# Patient Record
Sex: Male | Born: 1990 | Race: White | Hispanic: No | Marital: Single | Smoking: Never smoker
Health system: Southern US, Community
[De-identification: ages and names within clinical notes are randomized; demographics above are authoritative.]

## PROBLEM LIST (undated history)

## (undated) DIAGNOSIS — J45909 Unspecified asthma, uncomplicated: Secondary | ICD-10-CM

---
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
## 2012-05-05 ENCOUNTER — Emergency Department: Payer: Self-pay | Admitting: Emergency Medicine

## 2015-04-27 ENCOUNTER — Emergency Department: Payer: Self-pay

## 2015-04-27 ENCOUNTER — Emergency Department
Admission: EM | Admit: 2015-04-27 | Discharge: 2015-04-27 | Disposition: A | Discharge status: Home / Self Care | Payer: Self-pay | Attending: Emergency Medicine | Admitting: Emergency Medicine

## 2015-04-27 DIAGNOSIS — T148 Other injury of unspecified body region: Secondary | ICD-10-CM | POA: Insufficient documentation

## 2015-04-27 DIAGNOSIS — Y998 Other external cause status: Secondary | ICD-10-CM | POA: Insufficient documentation

## 2015-04-27 DIAGNOSIS — T07XXXA Unspecified multiple injuries, initial encounter: Secondary | ICD-10-CM

## 2015-04-27 DIAGNOSIS — R52 Pain, unspecified: Secondary | ICD-10-CM

## 2015-04-27 DIAGNOSIS — S299XXA Unspecified injury of thorax, initial encounter: Secondary | ICD-10-CM | POA: Insufficient documentation

## 2015-04-27 DIAGNOSIS — S199XXA Unspecified injury of neck, initial encounter: Secondary | ICD-10-CM | POA: Insufficient documentation

## 2015-04-27 DIAGNOSIS — Y9289 Other specified places as the place of occurrence of the external cause: Secondary | ICD-10-CM | POA: Insufficient documentation

## 2015-04-27 DIAGNOSIS — Y9389 Activity, other specified: Secondary | ICD-10-CM | POA: Insufficient documentation

## 2015-04-27 DIAGNOSIS — S79912A Unspecified injury of left hip, initial encounter: Secondary | ICD-10-CM | POA: Insufficient documentation

## 2015-04-27 DIAGNOSIS — S3991XA Unspecified injury of abdomen, initial encounter: Secondary | ICD-10-CM | POA: Insufficient documentation

## 2015-04-27 LAB — CBC
HCT: 40.5 % (ref 40.0–52.0)
Hemoglobin: 14 g/dL (ref 13.0–18.0)
MCH: 30.5 pg (ref 26.0–34.0)
MCHC: 34.4 g/dL (ref 32.0–36.0)
MCV: 88.5 fL (ref 80.0–100.0)
PLATELETS: 244 10*3/uL (ref 150–440)
RBC: 4.58 MIL/uL (ref 4.40–5.90)
RDW: 13.4 % (ref 11.5–14.5)
WBC: 9.8 10*3/uL (ref 3.8–10.6)

## 2015-04-27 LAB — BASIC METABOLIC PANEL
Anion gap: 15 (ref 5–15)
BUN: 9 mg/dL (ref 6–20)
CHLORIDE: 109 mmol/L (ref 101–111)
CO2: 17 mmol/L — ABNORMAL LOW (ref 22–32)
CREATININE: 0.57 mg/dL — AB (ref 0.61–1.24)
Calcium: 8.8 mg/dL — ABNORMAL LOW (ref 8.9–10.3)
GFR calc Af Amer: >60 mL/min (ref >60)
GLUCOSE: 100 mg/dL — AB (ref 65–99)
POTASSIUM: 3.9 mmol/L (ref 3.5–5.1)
Sodium: 141 mmol/L (ref 135–145)

## 2015-04-27 MED ORDER — IBUPROFEN 800 MG PO TABS
ORAL_TABLET | ORAL | Status: AC
  Administered 2015-04-27: 800 mg via ORAL
  Filled 2015-04-27: qty 1

## 2015-04-27 MED ORDER — OXYCODONE-ACETAMINOPHEN 5-325 MG PO TABS
ORAL_TABLET | ORAL | Status: AC
  Administered 2015-04-27: 1 via ORAL
  Filled 2015-04-27: qty 1

## 2015-04-27 MED ORDER — IOHEXOL 350 MG/ML SOLN
125.0000 mL | Freq: Once | INTRAVENOUS | Status: AC | PRN
  Administered 2015-04-27: 125 mL via INTRAVENOUS

## 2015-04-27 MED ORDER — OXYCODONE-ACETAMINOPHEN 5-325 MG PO TABS
1.0000 | ORAL_TABLET | Freq: Once | ORAL | Status: AC
  Administered 2015-04-27: 1 via ORAL

## 2015-04-27 MED ORDER — IBUPROFEN 800 MG PO TABS
800.0000 mg | ORAL_TABLET | Freq: Once | ORAL | Status: AC
  Administered 2015-04-27: 800 mg via ORAL

## 2015-04-27 MED ORDER — ONDANSETRON HCL 4 MG/2ML IJ SOLN
4.0000 mg | Freq: Once | INTRAMUSCULAR | Status: AC
  Administered 2015-04-27: 4 mg via INTRAVENOUS
  Filled 2015-04-27: qty 2

## 2015-04-27 MED ORDER — MORPHINE SULFATE (PF) 4 MG/ML IV SOLN
4.0000 mg | Freq: Once | INTRAVENOUS | Status: AC
  Administered 2015-04-27: 4 mg via INTRAVENOUS
  Filled 2015-04-27: qty 1

## 2015-04-27 MED ORDER — SODIUM CHLORIDE 0.9 % IV BOLUS (SEPSIS)
1000.0000 mL | Freq: Once | INTRAVENOUS | Status: AC
  Administered 2015-04-27: 1000 mL via INTRAVENOUS

## 2015-04-27 MED ORDER — OXYCODONE-ACETAMINOPHEN 5-325 MG PO TABS: 1.0000 | ORAL_TABLET | Freq: Four times a day (QID) | ORAL | Status: AC | PRN
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
## 2015-04-27 NOTE — Discharge Instructions (Signed)
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
## 2015-10-30 ENCOUNTER — Emergency Department (HOSPITAL_COMMUNITY)
Admission: EM | Admit: 2015-10-30 | Discharge: 2015-10-30 | Disposition: A | Discharge status: Home / Self Care | Payer: No Typology Code available for payment source | Attending: Emergency Medicine | Admitting: Emergency Medicine

## 2015-10-30 ENCOUNTER — Encounter (HOSPITAL_COMMUNITY): Payer: Self-pay | Admitting: *Deleted

## 2015-10-30 DIAGNOSIS — Y939 Activity, unspecified: Secondary | ICD-10-CM | POA: Insufficient documentation

## 2015-10-30 DIAGNOSIS — Y92410 Unspecified street and highway as the place of occurrence of the external cause: Secondary | ICD-10-CM | POA: Diagnosis not present

## 2015-10-30 DIAGNOSIS — S161XXA Strain of muscle, fascia and tendon at neck level, initial encounter: Secondary | ICD-10-CM

## 2015-10-30 DIAGNOSIS — S060X0A Concussion without loss of consciousness, initial encounter: Secondary | ICD-10-CM | POA: Insufficient documentation

## 2015-10-30 DIAGNOSIS — Y999 Unspecified external cause status: Secondary | ICD-10-CM | POA: Insufficient documentation

## 2015-10-30 DIAGNOSIS — S0990XA Unspecified injury of head, initial encounter: Secondary | ICD-10-CM | POA: Diagnosis present

## 2015-10-30 MED ORDER — KETOROLAC TROMETHAMINE 30 MG/ML IJ SOLN
15.0000 mg | Freq: Once | INTRAMUSCULAR | Status: AC
  Administered 2015-10-30: 15 mg via INTRAMUSCULAR
  Filled 2015-10-30: qty 1
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
## 2015-10-30 NOTE — Discharge Instructions (Signed)
You have neck pain, possibly from a cervical strain and/or pinched nerve.  ° °SEEK IMMEDIATE MEDICAL ATTENTION IF: °You develop difficulties swallowing or breathing.  °You have new or worse numbness, weakness, tingling, or movement problems in your arms or legs.  °You develop increasing pain which is uncontrolled with medications.  °You have change in bowel or bladder function, or other concerns. ° ° ° °

## 2015-10-30 NOTE — ED Notes (Signed)
Delay explained to pt.  

## 2015-11-10 ENCOUNTER — Encounter (HOSPITAL_COMMUNITY): Payer: Self-pay | Admitting: *Deleted

## 2015-11-10 ENCOUNTER — Emergency Department (HOSPITAL_COMMUNITY): Payer: No Typology Code available for payment source

## 2015-11-10 ENCOUNTER — Emergency Department (HOSPITAL_COMMUNITY)
Admission: EM | Admit: 2015-11-10 | Discharge: 2015-11-10 | Disposition: A | Discharge status: Home / Self Care | Payer: No Typology Code available for payment source | Attending: Emergency Medicine | Admitting: Emergency Medicine

## 2015-11-10 DIAGNOSIS — Y92410 Unspecified street and highway as the place of occurrence of the external cause: Secondary | ICD-10-CM | POA: Diagnosis not present

## 2015-11-10 DIAGNOSIS — S39012A Strain of muscle, fascia and tendon of lower back, initial encounter: Secondary | ICD-10-CM | POA: Diagnosis not present

## 2015-11-10 DIAGNOSIS — M545 Low back pain, unspecified: Secondary | ICD-10-CM

## 2015-11-10 DIAGNOSIS — Y939 Activity, unspecified: Secondary | ICD-10-CM | POA: Diagnosis not present

## 2015-11-10 DIAGNOSIS — T148XXA Other injury of unspecified body region, initial encounter: Secondary | ICD-10-CM

## 2015-11-10 DIAGNOSIS — Y999 Unspecified external cause status: Secondary | ICD-10-CM | POA: Insufficient documentation

## 2015-11-10 DIAGNOSIS — S3992XA Unspecified injury of lower back, initial encounter: Secondary | ICD-10-CM | POA: Diagnosis present

## 2015-11-10 MED ORDER — IBUPROFEN 800 MG PO TABS: 800.0000 mg | ORAL_TABLET | Freq: Three times a day (TID) | ORAL | 0 refills | Status: AC

## 2015-11-10 MED ORDER — KETOROLAC TROMETHAMINE 60 MG/2ML IM SOLN
60.0000 mg | Freq: Once | INTRAMUSCULAR | Status: AC
  Administered 2015-11-10: 60 mg via INTRAMUSCULAR
  Filled 2015-11-10: qty 2

## 2015-11-10 MED ORDER — HYDROCODONE-ACETAMINOPHEN 5-325 MG PO TABS: 1.0000 | ORAL_TABLET | Freq: Four times a day (QID) | ORAL | 0 refills | Status: AC | PRN

## 2015-11-10 MED ORDER — METHOCARBAMOL 500 MG PO TABS: 500.0000 mg | ORAL_TABLET | Freq: Two times a day (BID) | ORAL | 0 refills | Status: AC

## 2015-11-10 NOTE — Discharge Instructions (Signed)
1. Medications: robaxin, naproxyn, vicodin, usual home medications 2. Treatment: rest, drink plenty of fluids, gentle stretching as discussed, alternate ice and heat 3. Follow Up: Please followup with your primary doctor in 3 days for discussion of your diagnoses and further evaluation after today's visit; if you do not have a primary care doctor use the resource guide provided to find one;  Return to the ER for worsening back pain, difficulty walking, loss of bowel or bladder control or other concerning symptoms    
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
## 2015-11-13 ENCOUNTER — Emergency Department (HOSPITAL_COMMUNITY): Payer: No Typology Code available for payment source

## 2015-11-13 ENCOUNTER — Encounter (HOSPITAL_COMMUNITY): Payer: Self-pay | Admitting: Emergency Medicine

## 2015-11-13 ENCOUNTER — Ambulatory Visit (HOSPITAL_COMMUNITY)
Admission: EM | Admit: 2015-11-13 | Discharge: 2015-11-13 | Disposition: A | Discharge status: Nursing Home (Includes ICF) | Payer: Self-pay | Attending: Emergency Medicine | Admitting: Emergency Medicine

## 2015-11-13 ENCOUNTER — Emergency Department (HOSPITAL_COMMUNITY)
Admission: EM | Admit: 2015-11-13 | Discharge: 2015-11-13 | Disposition: A | Discharge status: Home / Self Care | Payer: No Typology Code available for payment source | Attending: Physician Assistant | Admitting: Physician Assistant

## 2015-11-13 DIAGNOSIS — R110 Nausea: Secondary | ICD-10-CM

## 2015-11-13 DIAGNOSIS — R112 Nausea with vomiting, unspecified: Secondary | ICD-10-CM

## 2015-11-13 DIAGNOSIS — N059 Unspecified nephritic syndrome with unspecified morphologic changes: Secondary | ICD-10-CM

## 2015-11-13 DIAGNOSIS — R1011 Right upper quadrant pain: Secondary | ICD-10-CM

## 2015-11-13 DIAGNOSIS — R109 Unspecified abdominal pain: Secondary | ICD-10-CM | POA: Diagnosis present

## 2015-11-13 HISTORY — DX: Unspecified asthma, uncomplicated: J45.909

## 2015-11-13 LAB — CBC
HEMATOCRIT: 45.3 % (ref 39.0–52.0)
HEMOGLOBIN: 15.1 g/dL (ref 13.0–17.0)
MCH: 30.4 pg (ref 26.0–34.0)
MCHC: 33.3 g/dL (ref 30.0–36.0)
MCV: 91.1 fL (ref 78.0–100.0)
Platelets: 245 10*3/uL (ref 150–400)
RBC: 4.97 MIL/uL (ref 4.22–5.81)
RDW: 12.9 % (ref 11.5–15.5)
WBC: 6.7 10*3/uL (ref 4.0–10.5)

## 2015-11-13 LAB — URINALYSIS, ROUTINE W REFLEX MICROSCOPIC
Bilirubin Urine: NEGATIVE
GLUCOSE, UA: NEGATIVE mg/dL
KETONES UR: NEGATIVE mg/dL
LEUKOCYTES UA: NEGATIVE
NITRITE: NEGATIVE
PH: 5.5 (ref 5.0–8.0)
Protein, ur: 30 mg/dL — AB
SPECIFIC GRAVITY, URINE: 1.009 (ref 1.005–1.030)

## 2015-11-13 LAB — URINE MICROSCOPIC-ADD ON

## 2015-11-13 LAB — COMPREHENSIVE METABOLIC PANEL
ALT: 14 U/L — ABNORMAL LOW (ref 17–63)
ANION GAP: 10 (ref 5–15)
AST: 16 U/L (ref 15–41)
Albumin: 4.6 g/dL (ref 3.5–5.0)
Alkaline Phosphatase: 66 U/L (ref 38–126)
BILIRUBIN TOTAL: 1.3 mg/dL — AB (ref 0.3–1.2)
BUN: 12 mg/dL (ref 6–20)
CHLORIDE: 106 mmol/L (ref 101–111)
CO2: 23 mmol/L (ref 22–32)
Calcium: 9.3 mg/dL (ref 8.9–10.3)
Creatinine, Ser: 1.52 mg/dL — ABNORMAL HIGH (ref 0.61–1.24)
GFR calc Af Amer: >60 mL/min (ref >60)
Glucose, Bld: 107 mg/dL — ABNORMAL HIGH (ref 65–99)
POTASSIUM: 3.6 mmol/L (ref 3.5–5.1)
Sodium: 139 mmol/L (ref 135–145)
TOTAL PROTEIN: 7.2 g/dL (ref 6.5–8.1)

## 2015-11-13 LAB — LIPASE, BLOOD: LIPASE: 21 U/L (ref 11–51)

## 2015-11-13 MED ORDER — ONDANSETRON HCL 4 MG/2ML IJ SOLN
4.0000 mg | Freq: Once | INTRAMUSCULAR | Status: AC
  Administered 2015-11-13: 4 mg via INTRAVENOUS
  Filled 2015-11-13: qty 2

## 2015-11-13 MED ORDER — ONDANSETRON 4 MG PO TBDP
ORAL_TABLET | ORAL | Status: AC
  Filled 2015-11-13: qty 1

## 2015-11-13 MED ORDER — IOPAMIDOL (ISOVUE-300) INJECTION 61%
INTRAVENOUS | Status: AC
  Administered 2015-11-13: 100 mL
  Filled 2015-11-13: qty 100

## 2015-11-13 MED ORDER — ONDANSETRON 4 MG PO TBDP
4.0000 mg | ORAL_TABLET | Freq: Once | ORAL | Status: AC
  Administered 2015-11-13: 4 mg via ORAL

## 2015-11-13 NOTE — ED Notes (Signed)
Pt transported to CT ?

## 2015-11-13 NOTE — ED Notes (Signed)
Provided pt with graham crackers and sprite per MD request for PO/fluid challenge.
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
## 2015-11-13 NOTE — ED Notes (Signed)
Patient verbalized understanding of discharge instructions and denies any further needs or questions at this time. VS stable. Patient ambulatory with steady gait.  
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
## 2015-11-13 NOTE — Discharge Instructions (Signed)
We need you to follow up with her primary care physician this week. You will need to get your kidney function rechecked. Be sure to stay hydrated between now and then. If you have any concerns please return to the emergency department. You should also contact urology for follow-up.  Do not take any ibuprofen until you follow up.

## 2015-11-13 NOTE — ED Notes (Signed)
MD at bedside. 
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
## 2018-10-04 ENCOUNTER — Encounter (HOSPITAL_COMMUNITY): Payer: Self-pay | Admitting: Emergency Medicine

## 2018-10-04 ENCOUNTER — Emergency Department (HOSPITAL_COMMUNITY): Payer: Self-pay

## 2018-10-04 ENCOUNTER — Other Ambulatory Visit: Payer: Self-pay

## 2018-10-04 ENCOUNTER — Emergency Department (HOSPITAL_COMMUNITY)
Admission: EM | Admit: 2018-10-04 | Discharge: 2018-10-04 | Disposition: A | Discharge status: Home / Self Care | Payer: Self-pay | Attending: Emergency Medicine | Admitting: Emergency Medicine

## 2018-10-04 DIAGNOSIS — R630 Anorexia: Secondary | ICD-10-CM | POA: Insufficient documentation

## 2018-10-04 DIAGNOSIS — K6289 Other specified diseases of anus and rectum: Secondary | ICD-10-CM | POA: Insufficient documentation

## 2018-10-04 DIAGNOSIS — R110 Nausea: Secondary | ICD-10-CM | POA: Insufficient documentation

## 2018-10-04 DIAGNOSIS — R1084 Generalized abdominal pain: Secondary | ICD-10-CM | POA: Insufficient documentation

## 2018-10-04 LAB — CBC WITH DIFFERENTIAL/PLATELET
Abs Immature Granulocytes: 0.09 10*3/uL — ABNORMAL HIGH (ref 0.00–0.07)
Basophils Absolute: 0.1 10*3/uL (ref 0.0–0.1)
Basophils Relative: 1 %
Eosinophils Absolute: 0.2 10*3/uL (ref 0.0–0.5)
Eosinophils Relative: 3 %
HCT: 48.4 % (ref 39.0–52.0)
Hemoglobin: 16 g/dL (ref 13.0–17.0)
Immature Granulocytes: 2 %
Lymphocytes Relative: 21 %
Lymphs Abs: 1.3 10*3/uL (ref 0.7–4.0)
MCH: 30.7 pg (ref 26.0–34.0)
MCHC: 33.1 g/dL (ref 30.0–36.0)
MCV: 92.7 fL (ref 80.0–100.0)
Monocytes Absolute: 0.6 10*3/uL (ref 0.1–1.0)
Monocytes Relative: 9 %
Neutro Abs: 3.8 10*3/uL (ref 1.7–7.7)
Neutrophils Relative %: 64 %
Platelets: 302 10*3/uL (ref 150–400)
RBC: 5.22 MIL/uL (ref 4.22–5.81)
RDW: 13 % (ref 11.5–15.5)
WBC: 6 10*3/uL (ref 4.0–10.5)
nRBC: 0 % (ref 0.0–0.2)

## 2018-10-04 LAB — BASIC METABOLIC PANEL
Anion gap: 14 (ref 5–15)
BUN: 12 mg/dL (ref 6–20)
CO2: 22 mmol/L (ref 22–32)
Calcium: 9.1 mg/dL (ref 8.9–10.3)
Chloride: 101 mmol/L (ref 98–111)
Creatinine, Ser: 0.91 mg/dL (ref 0.61–1.24)
GFR calc Af Amer: >60 mL/min (ref >60)
GFR calc non Af Amer: >60 mL/min (ref >60)
Glucose, Bld: 131 mg/dL — ABNORMAL HIGH (ref 70–99)
Potassium: 3.9 mmol/L (ref 3.5–5.1)
Sodium: 137 mmol/L (ref 135–145)

## 2018-10-04 MED ORDER — SENNOSIDES-DOCUSATE SODIUM 8.6-50 MG PO TABS: 2.0000 | ORAL_TABLET | Freq: Every day | ORAL | 0 refills | Status: AC

## 2018-10-04 MED ORDER — IOHEXOL 300 MG/ML  SOLN
100.0000 mL | Freq: Once | INTRAMUSCULAR | Status: AC | PRN
  Administered 2018-10-04: 100 mL via INTRAVENOUS

## 2018-10-04 MED ORDER — LACTATED RINGERS IV BOLUS
1000.0000 mL | Freq: Once | INTRAVENOUS | Status: AC
  Administered 2018-10-04: 1000 mL via INTRAVENOUS

## 2018-10-04 MED ORDER — FLEET ENEMA 7-19 GM/118ML RE ENEM: 1.0000 | ENEMA | Freq: Every day | RECTAL | 0 refills | Status: AC | PRN

## 2018-10-04 NOTE — ED Triage Notes (Signed)
Pt c/o severe constipation x 4 days, pt reports he has used several OTC treatments with some diarrhea, pt now having difficulty urinating and painful sitting down.

## 2018-10-04 NOTE — ED Provider Notes (Signed)
[facility] Provider Note   CSN: [id number] Arrival date & time: 10/04/18  0048    History   Chief Complaint Chief Complaint  Patient presents with   Abdominal Pain    HPI Mathew Blackwell is a 28 y.o. male.     HPI Patient presents with abdominal pain, decreased bowel movements. Patient notes that he is generally well denies chronic medical issues, takes no prescription medication, nor pain medication regularly. Over the past days he has developed diffuse abdominal discomfort with diminished bowel movements. There is some associated anorexia, nausea, but no vomiting. During this illness the pain is been slightly more in the left side, but has been diffuse.  Pain is now persistent, worse with attempted defecation or urination. Patient has tried multiple OTC medication for relief, with only minimal change in his condition. Patient denies substantial history of family illness, denies abdominal surgery, denies obvious precipitant. With some suspicion for novel coronavirus the patient had COVID testing performed 4 days ago, this was reportedly negative. Past Medical History:  Diagnosis Date   Asthma     There are no active problems to display for this patient.   History reviewed. No pertinent surgical history.      Home Medications    Prior to Admission medications   Medication Sig Start Date End Date Taking? Authorizing Provider  acetaminophen (TYLENOL) 500 MG tablet Take 1,000 mg by mouth every 6 (six) hours as needed for mild pain or moderate pain.   Yes [provider]  HYDROcodone-acetaminophen (NORCO/VICODIN) 5-325 MG tablet Take 1 tablet by mouth every 6 (six) hours as needed for moderate pain or severe pain. Patient not taking: Reported on 10/04/2018 11/10/15   Muthersbaugh, Dahlia ClientHannah, PA-C  ibuprofen (ADVIL,MOTRIN) 800 MG tablet Take 1 tablet (800 mg total) by mouth 3 (three) times daily. Patient not taking: Reported  on 10/04/2018 11/10/15   Muthersbaugh, Dahlia ClientHannah, PA-C  methocarbamol (ROBAXIN) 500 MG tablet Take 1 tablet (500 mg total) by mouth 2 (two) times daily. Patient not taking: Reported on 10/04/2018 11/10/15   Muthersbaugh, Dahlia ClientHannah, PA-C  oxyCODONE-acetaminophen (ROXICET) 5-325 MG tablet Take 1 tablet by mouth every 6 (six) hours as needed. Patient not taking: Reported on 10/04/2018 04/27/15   Minna AntisPaduchowski, Kevin, MD    Family History No family history on file.  Social History Social History   Tobacco Use   Smoking status: Never Smoker   Smokeless tobacco: Never Used  Substance Use Topics   Alcohol use: No   Drug use: No     Allergies   Cat hair extract and Dog epithelium allergy skin test   Review of Systems Review of Systems  Constitutional:       Per HPI, otherwise negative  HENT:       Per HPI, otherwise negative  Respiratory:       Per HPI, otherwise negative  Cardiovascular:       Per HPI, otherwise negative  Gastrointestinal: Positive for abdominal pain, nausea and rectal pain. Negative for vomiting.  Endocrine:       Negative aside from HPI  Genitourinary:       Neg aside from HPI   Musculoskeletal:       Per HPI, otherwise negative  Skin: Negative.   Neurological: Negative for syncope.     Physical Exam Updated Vital Signs BP (!) 145/92 (BP Location: Right Arm)    Pulse 76    Temp 98.5 F (36.9 C) (Oral)    Resp  18    Ht 5\' 6"  (1.676 m)    Wt 100 kg    SpO2 96%    BMI 35.58 kg/m   Physical Exam Vitals signs and nursing note reviewed.  Constitutional:      General: He is not in acute distress.    Appearance: He is well-developed.  HENT:     Head: Normocephalic and atraumatic.  Eyes:     Conjunctiva/sclera: Conjunctivae normal.  Cardiovascular:     Rate and Rhythm: Normal rate and regular rhythm.  Pulmonary:     Effort: Pulmonary effort is normal. No respiratory distress.     Breath sounds: No stridor.  Abdominal:     General: There is no distension.       Tenderness: There is generalized abdominal tenderness.     Comments: Mild diffuse tenderness without peritoneal findings  Genitourinary:   Skin:    General: Skin is warm and dry.  Neurological:     Mental Status: He is alert and oriented to person, place, and time.      ED Treatments / Results  Labs (all labs ordered are listed, but only abnormal results are displayed) Labs Reviewed  BASIC METABOLIC PANEL - Abnormal; Notable for the following components:      Result Value   Glucose, Bld 131 (*)    All other components within normal limits  CBC WITH DIFFERENTIAL/PLATELET - Abnormal; Notable for the following components:   Abs Immature Granulocytes 0.09 (*)    All other components within normal limits    EKG None  Radiology Ct Abdomen Pelvis W Contrast  Result Date: 10/04/2018 CLINICAL DATA:  LLQ pain x 3 days, with constipation; No prior abdominal surgeries EXAM: CT ABDOMEN AND PELVIS WITH CONTRAST TECHNIQUE: Multidetector CT imaging of the abdomen and pelvis was performed using the standard protocol following bolus administration of intravenous contrast. CONTRAST:  100mL OMNIPAQUE IOHEXOL 300 MG/ML  SOLN COMPARISON:  11/14/2015 FINDINGS: Lower chest: No acute abnormality. Hepatobiliary: Fatty liver. No focal lesion. No bile duct dilatation. Gallbladder nondistended, unremarkable. Pancreas: Unremarkable. No pancreatic ductal dilatation or surrounding inflammatory changes. Spleen: Normal in size without focal abnormality. Adrenals/Urinary Tract: Normal adrenals. Normal kidneys. Urinary bladder incompletely distended. Stomach/Bowel: Stomach nondistended. Small bowel decompressed. Normal appendix. Colon decompressed, unremarkable. No evident rectal wall thickening or adjacent inflammatory change. Vascular/Lymphatic: No significant vascular findings are present. No enlarged abdominal or pelvic lymph nodes. Reproductive: Prostate is unremarkable. Other: No ascites. No free air.  Musculoskeletal: Small umbilical hernia containing only mesenteric fat. No fracture or worrisome bone lesion. IMPRESSION: 1. No acute findings. 2. Fatty liver. 3. Small umbilical hernia containing only mesenteric fat. Electronically Signed   By: Corlis Leak  Hassell M.D.   On: 10/04/2018 10:31   Dg Abdomen Acute W/chest  Result Date: 10/04/2018 CLINICAL DATA:  Constipation for 4 days. EXAM: DG ABDOMEN ACUTE W/ 1V CHEST COMPARISON:  CT 11/14/2015 FINDINGS: The cardiomediastinal contours are normal. The lungs are clear. There is no free intra-abdominal air. No dilated bowel loops to suggest obstruction. Small volume of stool throughout in the descending colon. No abnormal rectal distention. No radiopaque calculi. No acute osseous abnormalities are seen. IMPRESSION: 1. Small volume of colonic stool.  No obstruction or free air. 2. Clear lungs. Electronically Signed   By: Narda RutherfordMelanie  Sanford M.D.   On: 10/04/2018 01:56    Procedures Procedures (including critical care time)  Medications Ordered in ED Medications  lactated ringers bolus 1,000 mL (0 mLs Intravenous Stopped 10/04/18 0937)  iohexol (OMNIPAQUE)  300 MG/ML solution 100 mL (100 mLs Intravenous Contrast Given 10/04/18 1020)     Initial Impression / Assessment and Plan / ED Course  I have reviewed the triage vital signs and the nursing notes.  Pertinent labs & imaging results that were available during my care of the patient were reviewed by me and considered in my medical decision making (see chart for details).        10:54 AM On repeat exam patient is awake, alert, in no distress. I reviewed the patient's CT findings, labs with him. He has received fluids.  I we discussed options for improving his abdominal discomfort which may be secondary to mild constipation, but without evidence for colitis, proctitis, fecal impaction. Patient amenable to trial of outpatient meds, understands return precautions, and need to follow-up with primary care. With  unremarkable vital signs, labs notable only for mild hyperglycemia, the patient was discharged in stable condition.  Final Clinical Impressions(s) / ED Diagnoses   Final diagnoses:  Generalized abdominal pain    ED Discharge Orders         Ordered    senna-docusate (SENOKOT-S) 8.6-50 MG tablet  Daily     10/04/18 1059    sodium phosphate (FLEET) 7-19 GM/118ML ENEM  Daily PRN     10/04/18 1059           Carmin Muskrat, MD 10/04/18 1059

## 2018-10-04 NOTE — ED Notes (Signed)
Patient verbalizes understanding of discharge instructions. Opportunity for questioning and answers were provided. Armband removed by staff, pt discharged from ED.  

## 2018-10-04 NOTE — Discharge Instructions (Signed)
As discussed, your abdominal pain may be due to constipation. It is important to take your new medication regimen, monitor your condition, and do not hesitate to return here. Otherwise, please follow-up with our affiliated primary care center within 7 to 10 days.

## 2020-03-11 IMAGING — DX DG ABDOMEN ACUTE W/ 1V CHEST
3 series · 3 of 3 positions shown · non-contrast
Comparison: CT 11/14/2015

CLINICAL DATA: Constipation for 4 days.

EXAM:
DG ABDOMEN ACUTE W/ 1V CHEST

[chest pa]
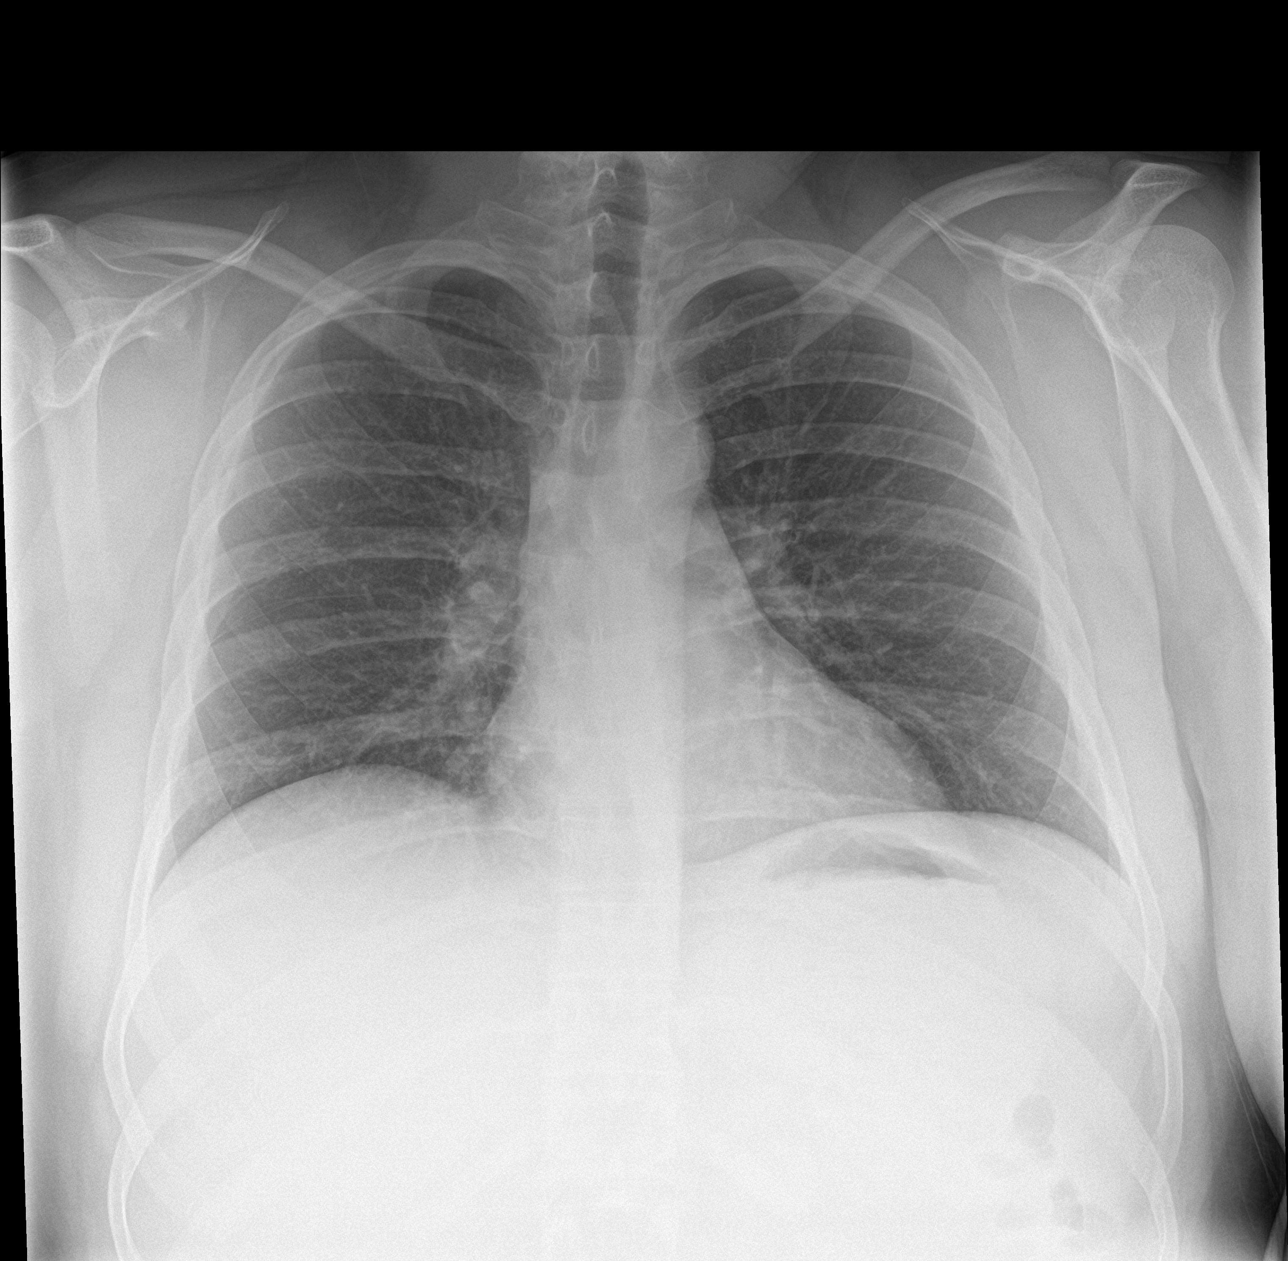

[abdomen erect]
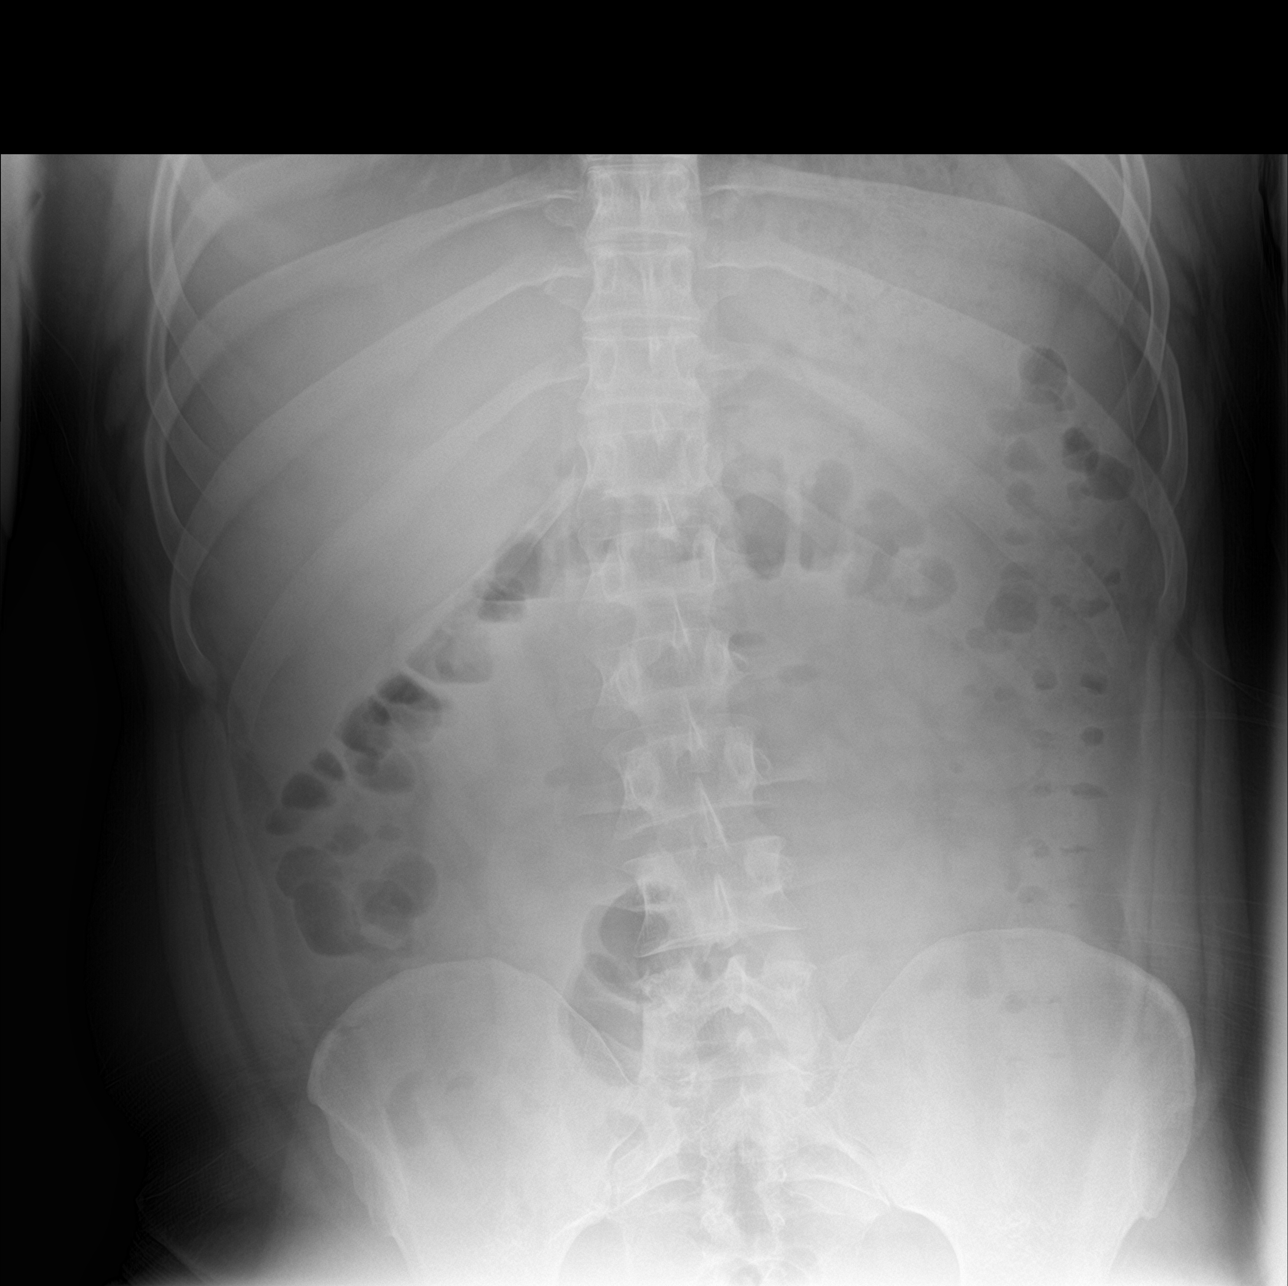

[abdomen supine]
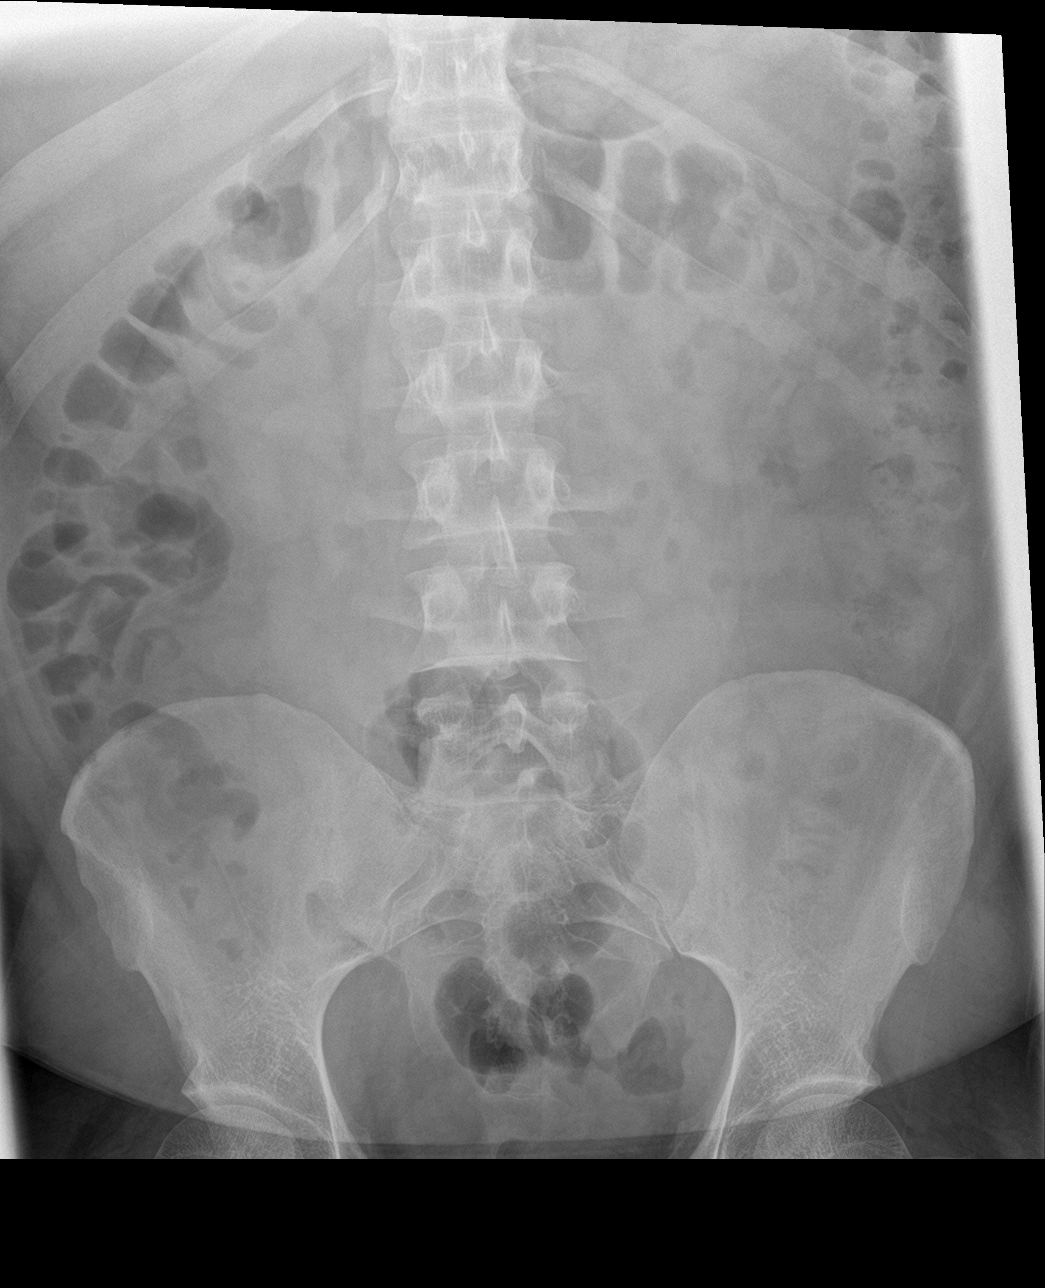

[3 of 3 positions shown; findings below may reference images not displayed]

FINDINGS: The cardiomediastinal contours are normal. The lungs are clear.
There is no free intra-abdominal air. No dilated bowel loops to
suggest obstruction. Small volume of stool throughout in the
descending colon. No abnormal rectal distention. No radiopaque
calculi. No acute osseous abnormalities are seen.
IMPRESSION: 1. Small volume of colonic stool.  No obstruction or free air.
2. Clear lungs.

## 2020-03-11 IMAGING — CT CT ABDOMEN AND PELVIS WITH CONTRAST
2 of 4 series · 16 of 46 positions shown, 18 images · IV contrast (omnipaque)
Comparison: 11/14/2015

CLINICAL DATA: LLQ pain x 3 days, with constipation; No prior
abdominal surgeries

EXAM:
CT ABDOMEN AND PELVIS WITH CONTRAST
TECHNIQUE: Multidetector CT imaging of the abdomen and pelvis was performed
using the standard protocol following bolus administration of
intravenous contrast.
CONTRAST:  100mL OMNIPAQUE IOHEXOL 300 MG/ML  SOLN

[Series 3: abd/ pelvis 5.0 i30f 2 · axial · 0.87mm/px · z∈[+654,+1094]mm · 13 of 98 slices shown, 15 images]
[im 5/98  soft-tissue]
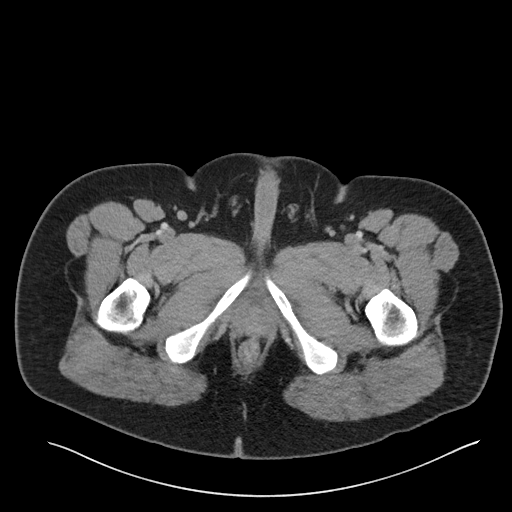
[im 5/98  bone]
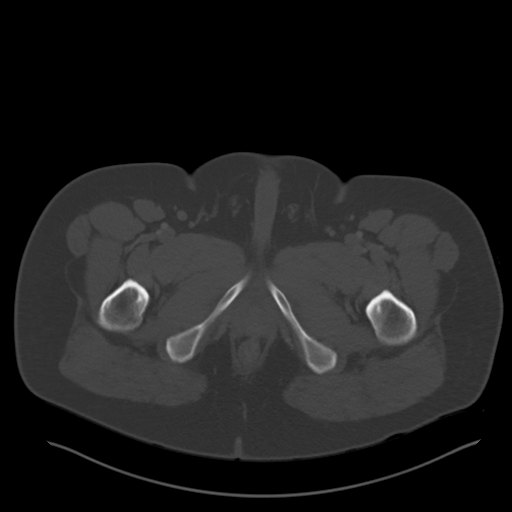
[im 13/98  soft-tissue]
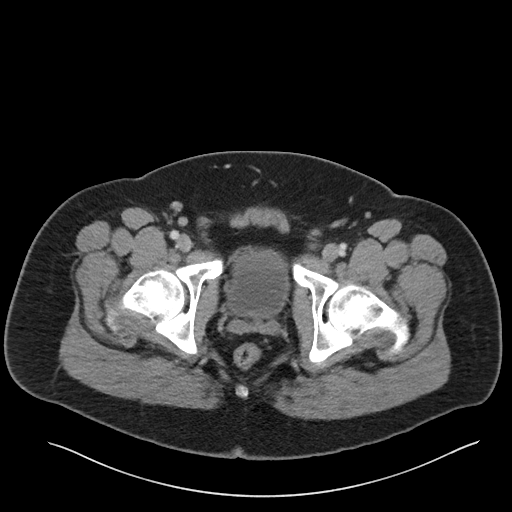
[im 22/98  soft-tissue]
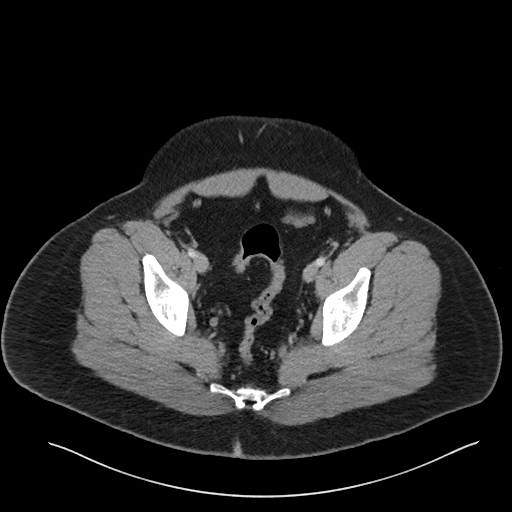
[im 26/98  soft-tissue]
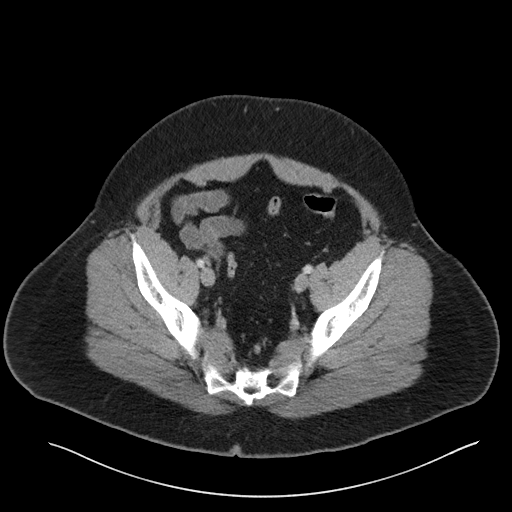
[im 34/98  soft-tissue]
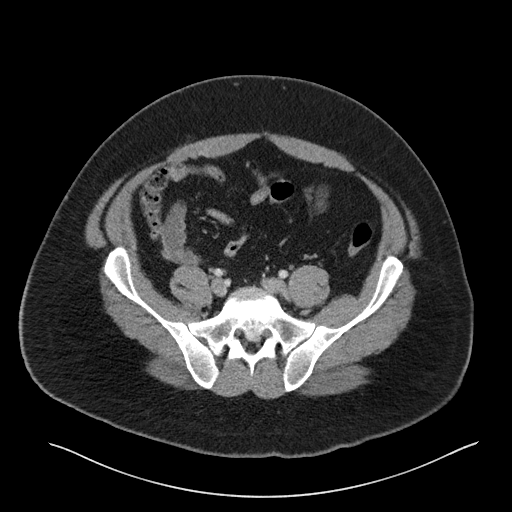
[im 43/98  soft-tissue]
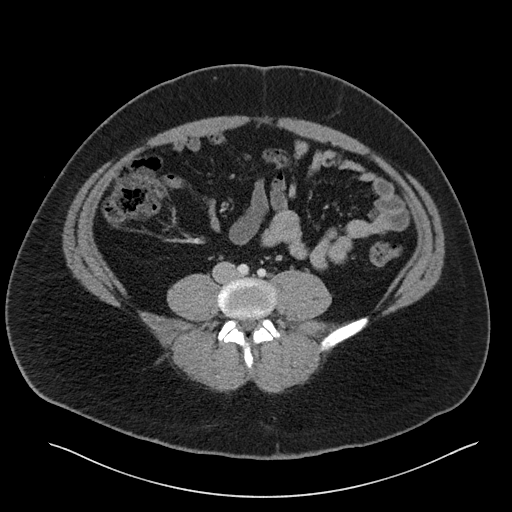
[im 51/98  soft-tissue]
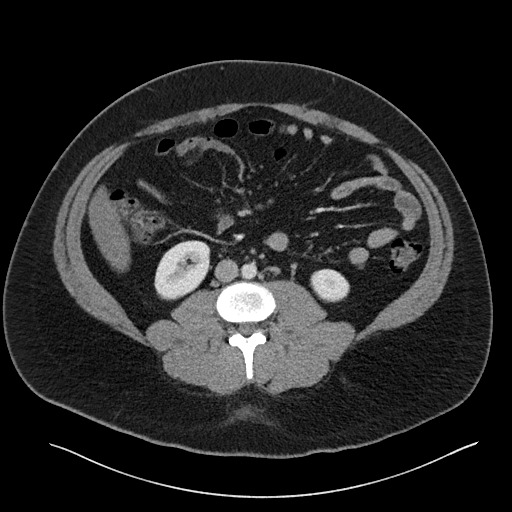
[im 55/98  soft-tissue]
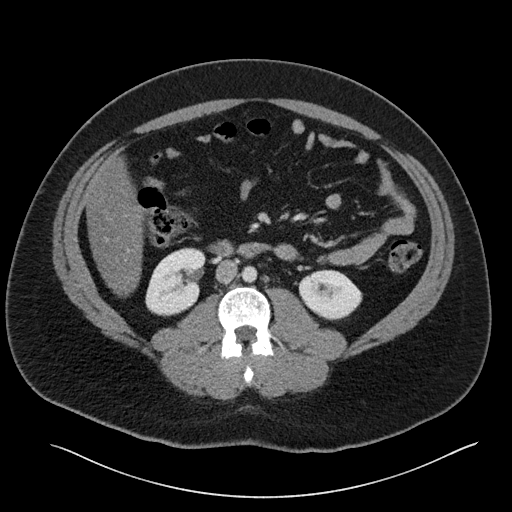
[im 64/98  soft-tissue]
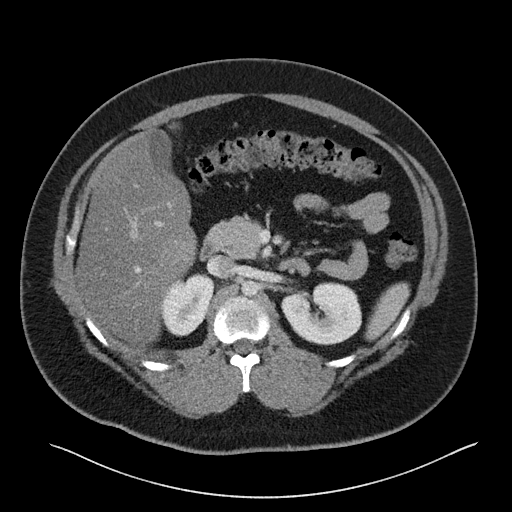
[im 64/98  bone]
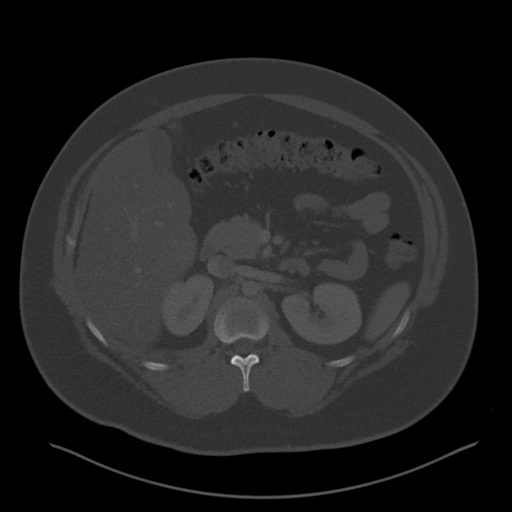
[im 72/98  soft-tissue]
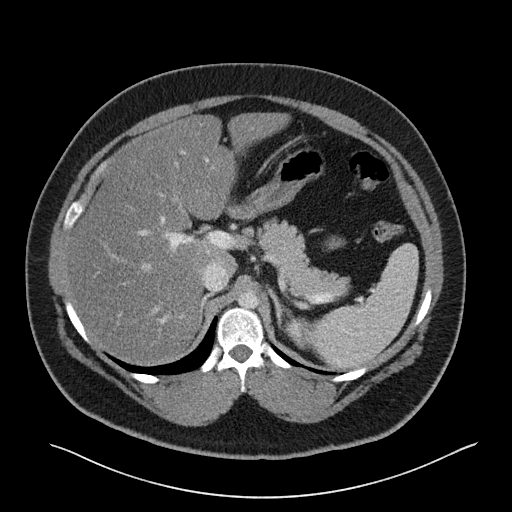
[im 76/98  soft-tissue]
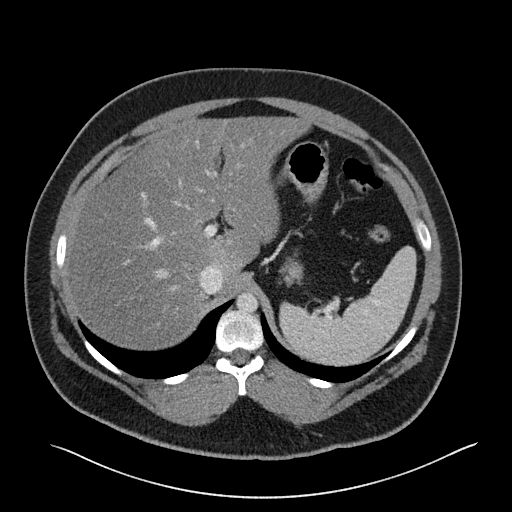
[im 85/98  soft-tissue]
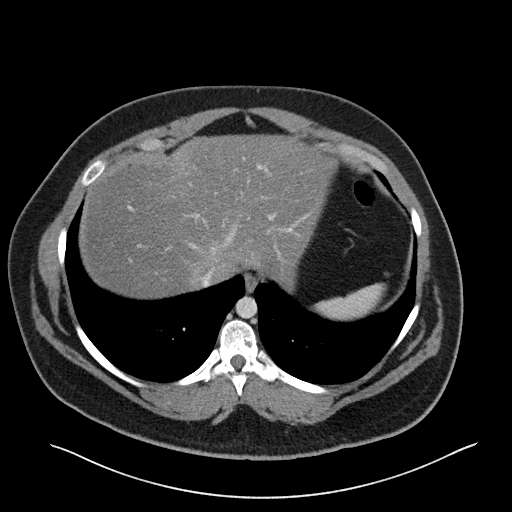
[im 93/98  soft-tissue]
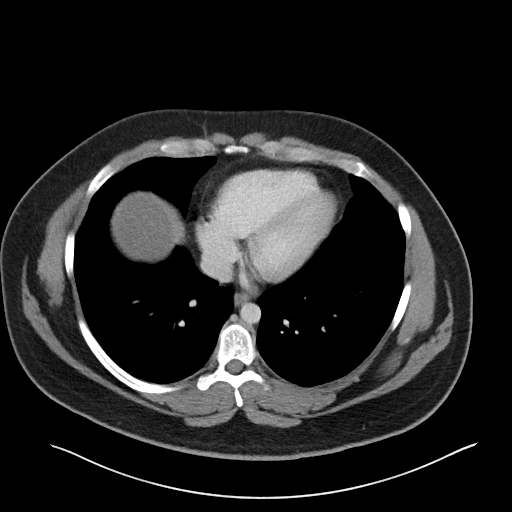

[Series 6: coronal soft tissue · coronal · 0.83mm/px · 3 of 108 slices shown]
[im 36/108  soft-tissue]
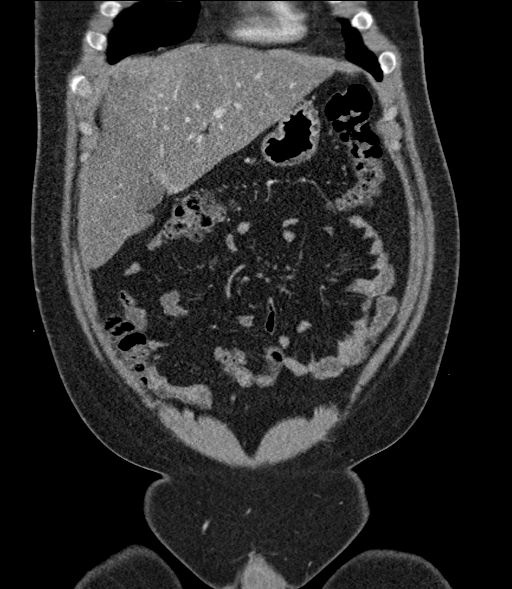
[im 48/108  soft-tissue]
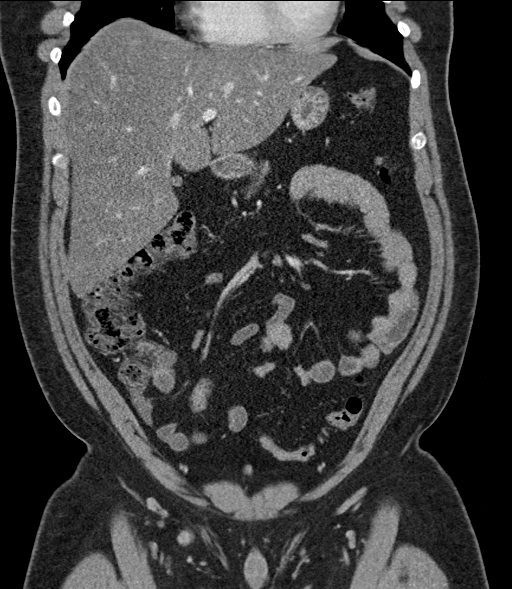
[im 60/108  soft-tissue]
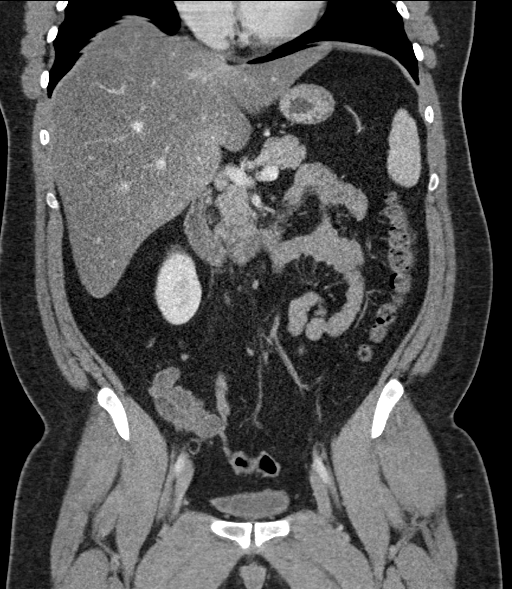

[16 of 46 positions shown; findings below may reference images not displayed]

FINDINGS: Lower chest: No acute abnormality.

Hepatobiliary: Fatty liver. No focal lesion. No bile duct
dilatation. Gallbladder nondistended, unremarkable.

Pancreas: Unremarkable. No pancreatic ductal dilatation or
surrounding inflammatory changes.

Spleen: Normal in size without focal abnormality.

Adrenals/Urinary Tract: Normal adrenals. Normal kidneys. Urinary
bladder incompletely distended.

Stomach/Bowel: Stomach nondistended. Small bowel decompressed.
Normal appendix. Colon decompressed, unremarkable. No evident rectal
wall thickening or adjacent inflammatory change.

Vascular/Lymphatic: No significant vascular findings are present. No
enlarged abdominal or pelvic lymph nodes.

Reproductive: Prostate is unremarkable.

Other: No ascites. No free air.

Musculoskeletal: Small umbilical hernia containing only mesenteric
fat. No fracture or worrisome bone lesion.
IMPRESSION: 1. No acute findings.
2. Fatty liver.
3. Small umbilical hernia containing only mesenteric fat.
